# Patient Record
Sex: Female | Born: 1985 | Race: White | Hispanic: No | Marital: Single | Smoking: Current every day smoker
Health system: Southern US, Community
[De-identification: ages and names within clinical notes are randomized; demographics above are authoritative.]

## PROBLEM LIST (undated history)

## (undated) ENCOUNTER — Inpatient Hospital Stay (HOSPITAL_COMMUNITY): Payer: Self-pay

## (undated) DIAGNOSIS — F419 Anxiety disorder, unspecified: Secondary | ICD-10-CM

## (undated) HISTORY — PX: WISDOM TOOTH EXTRACTION: SHX21

---
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
## 2008-04-02 ENCOUNTER — Emergency Department (HOSPITAL_COMMUNITY): Admission: EM | Admit: 2008-04-02 | Discharge: 2008-04-02 | Payer: Self-pay | Admitting: Emergency Medicine

## 2008-06-16 ENCOUNTER — Emergency Department: Payer: Self-pay | Admitting: Emergency Medicine

## 2009-02-03 ENCOUNTER — Emergency Department (HOSPITAL_COMMUNITY): Admission: EM | Admit: 2009-02-03 | Discharge: 2009-02-03 | Payer: Self-pay | Admitting: Emergency Medicine

## 2009-11-01 ENCOUNTER — Emergency Department (HOSPITAL_COMMUNITY): Admission: EM | Admit: 2009-11-01 | Discharge: 2009-11-01 | Payer: Self-pay | Admitting: Emergency Medicine

## 2010-03-18 IMAGING — CR DG CHEST 2V
2 series · 2 of 2 positions shown · non-contrast
Comparison: . None

CLINICAL DATA: Cough.  Smoker.

CHEST - 2 VIEW

[w chest pa]
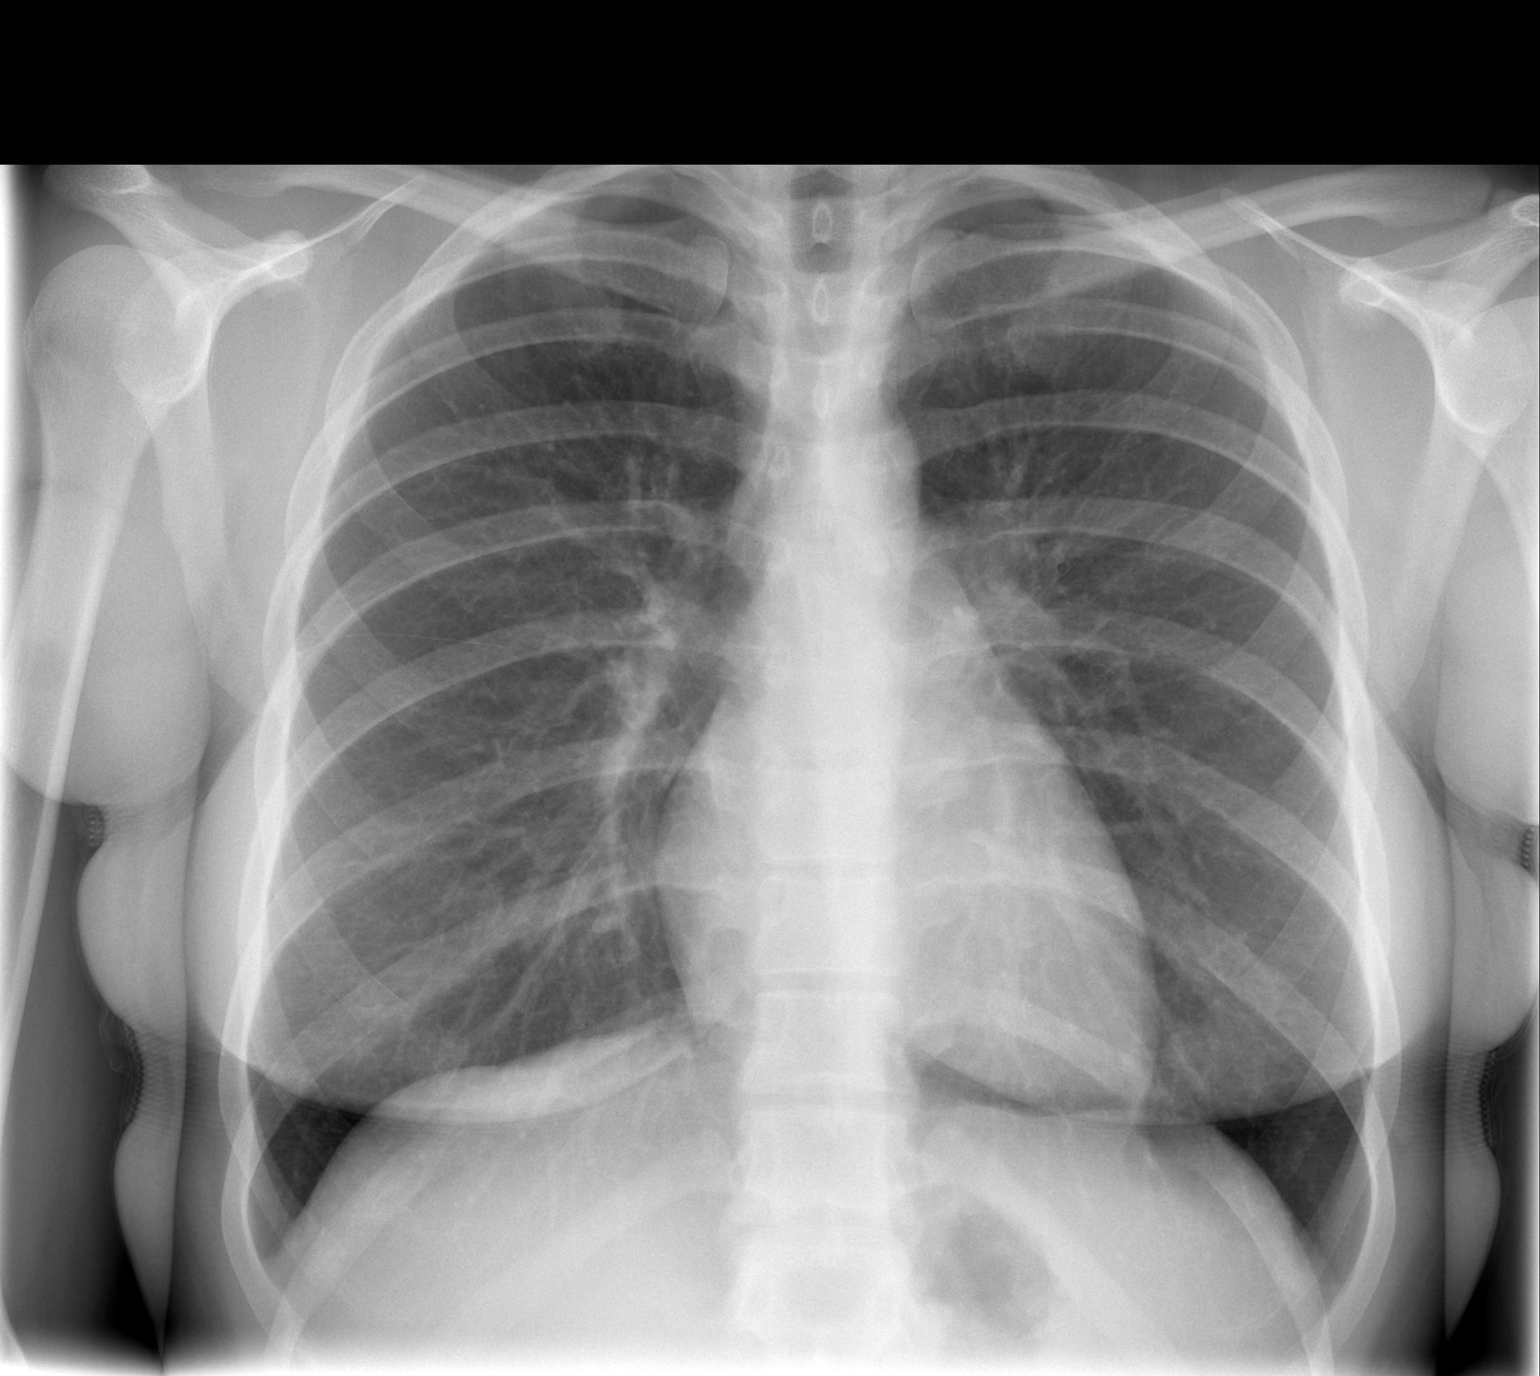

[w chest lat]
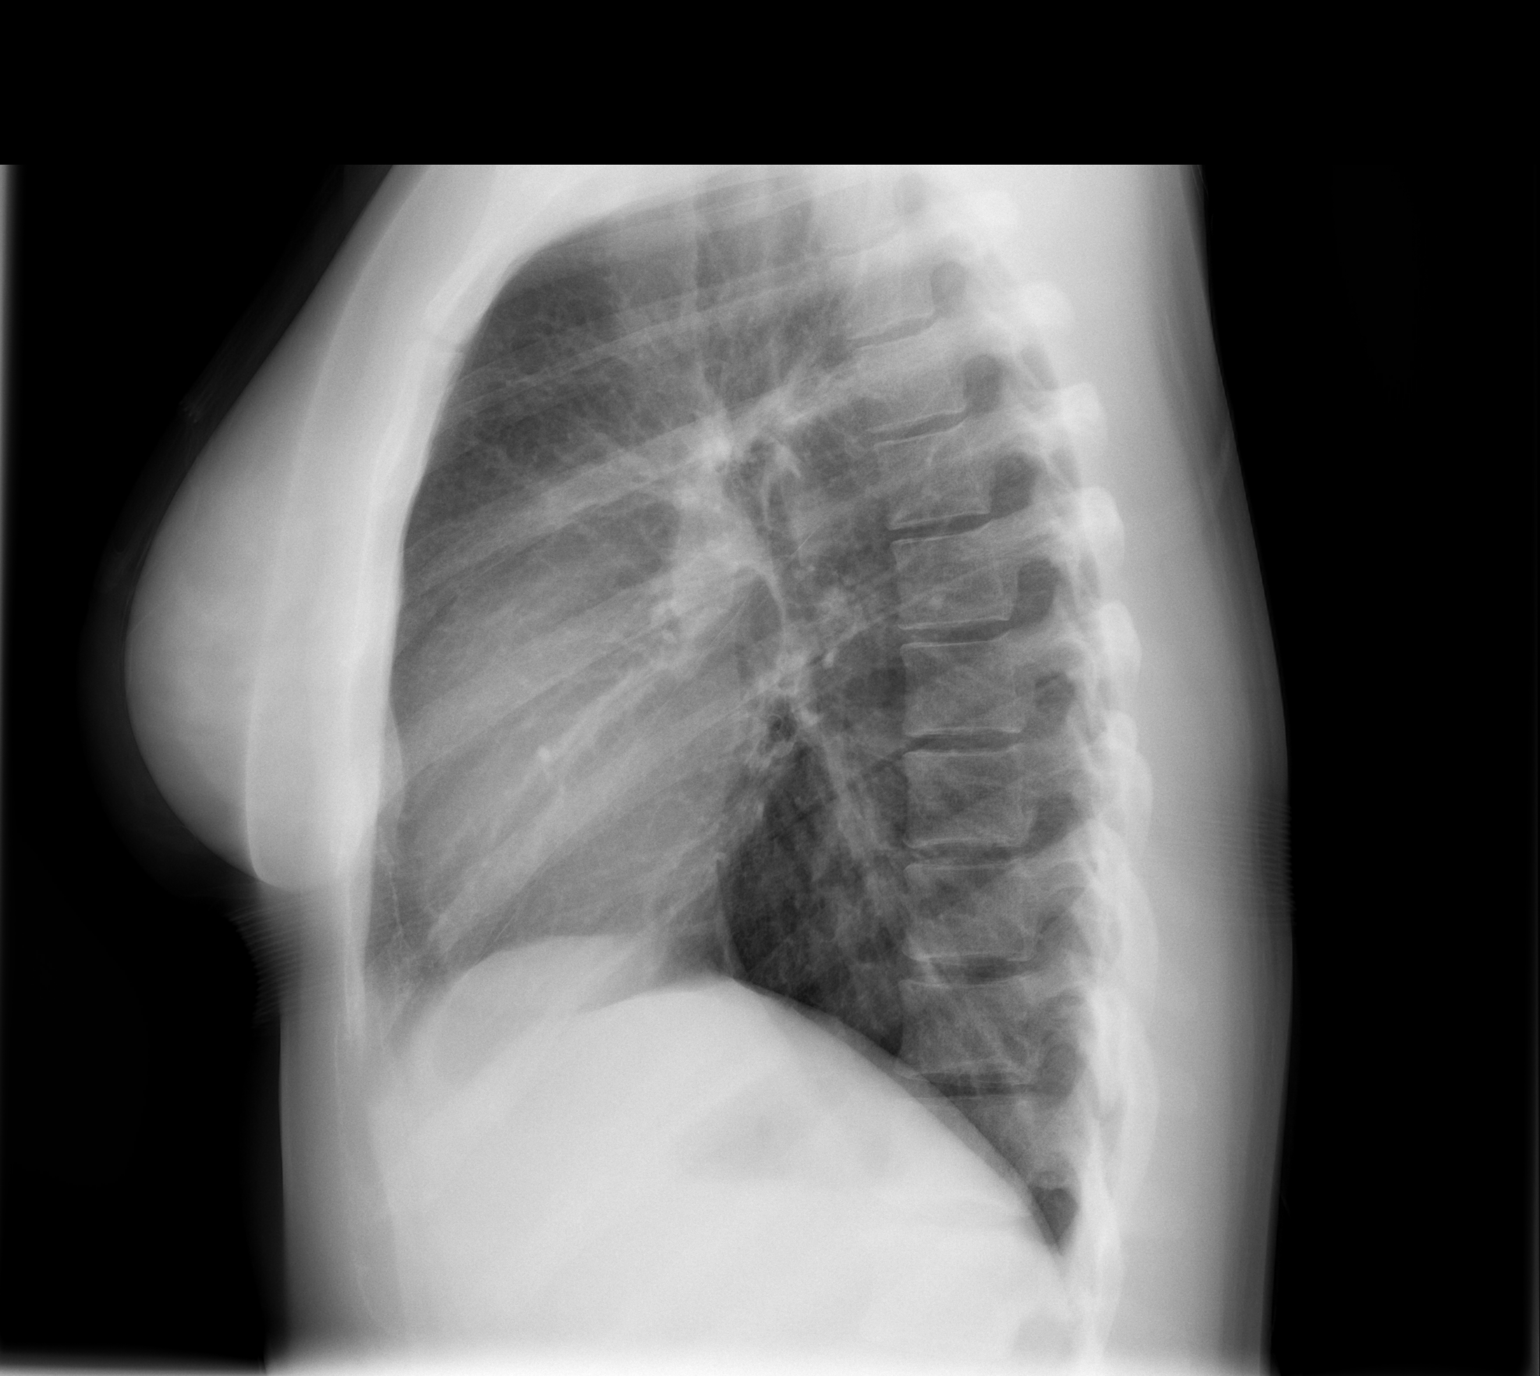

[2 of 2 positions shown; findings below may reference images not displayed]

FINDINGS: Mild to moderate hyperaeration of the lungs.  No acute
infiltrative process.  Normal cardiomediastinal silhouette.
Symmetrical hila.
IMPRESSION: Mild to moderate hyperaeration.

## 2010-12-23 ENCOUNTER — Encounter: Payer: Self-pay | Admitting: *Deleted

## 2011-03-05 LAB — POCT PREGNANCY, URINE: Preg Test, Ur: NEGATIVE

## 2011-03-14 LAB — URINALYSIS, ROUTINE W REFLEX MICROSCOPIC
Bilirubin Urine: NEGATIVE
Protein, ur: NEGATIVE mg/dL
Specific Gravity, Urine: 1.019 (ref 1.005–1.030)
Urobilinogen, UA: 0.2 mg/dL (ref 0.0–1.0)
pH: 8 (ref 5.0–8.0)

## 2011-03-14 LAB — URINE MICROSCOPIC-ADD ON

## 2011-05-01 ENCOUNTER — Other Ambulatory Visit: Payer: Self-pay | Admitting: Adult Health

## 2011-05-01 ENCOUNTER — Other Ambulatory Visit (HOSPITAL_COMMUNITY)
Admission: RE | Admit: 2011-05-01 | Discharge: 2011-05-01 | Disposition: A | Discharge status: Home / Self Care | Payer: Self-pay | Source: Ambulatory Visit | Attending: Obstetrics and Gynecology | Admitting: Obstetrics and Gynecology

## 2011-05-01 DIAGNOSIS — Z113 Encounter for screening for infections with a predominantly sexual mode of transmission: Secondary | ICD-10-CM | POA: Insufficient documentation

## 2011-05-01 DIAGNOSIS — Z01419 Encounter for gynecological examination (general) (routine) without abnormal findings: Secondary | ICD-10-CM | POA: Insufficient documentation

## 2013-09-10 ENCOUNTER — Encounter (HOSPITAL_COMMUNITY): Payer: Self-pay

## 2013-09-10 ENCOUNTER — Inpatient Hospital Stay (HOSPITAL_COMMUNITY): Payer: Self-pay

## 2013-09-10 ENCOUNTER — Inpatient Hospital Stay (HOSPITAL_COMMUNITY)
Admission: AD | Admit: 2013-09-10 | Discharge: 2013-09-10 | Disposition: A | Discharge status: Home / Self Care | Payer: Self-pay | Source: Ambulatory Visit | Attending: Obstetrics & Gynecology | Admitting: Obstetrics & Gynecology

## 2013-09-10 DIAGNOSIS — B373 Candidiasis of vulva and vagina: Secondary | ICD-10-CM

## 2013-09-10 DIAGNOSIS — O2390 Unspecified genitourinary tract infection in pregnancy, unspecified trimester: Secondary | ICD-10-CM | POA: Insufficient documentation

## 2013-09-10 DIAGNOSIS — O9989 Other specified diseases and conditions complicating pregnancy, childbirth and the puerperium: Secondary | ICD-10-CM

## 2013-09-10 DIAGNOSIS — R109 Unspecified abdominal pain: Secondary | ICD-10-CM | POA: Insufficient documentation

## 2013-09-10 DIAGNOSIS — B3731 Acute candidiasis of vulva and vagina: Secondary | ICD-10-CM | POA: Insufficient documentation

## 2013-09-10 DIAGNOSIS — O26899 Other specified pregnancy related conditions, unspecified trimester: Secondary | ICD-10-CM

## 2013-09-10 HISTORY — DX: Anxiety disorder, unspecified: F41.9

## 2013-09-10 LAB — URINALYSIS, ROUTINE W REFLEX MICROSCOPIC
Bilirubin Urine: NEGATIVE
Glucose, UA: NEGATIVE mg/dL
Ketones, ur: NEGATIVE mg/dL
Leukocytes, UA: NEGATIVE
Protein, ur: NEGATIVE mg/dL
pH: 5.5 (ref 5.0–8.0)

## 2013-09-10 LAB — URINE MICROSCOPIC-ADD ON

## 2013-09-10 LAB — WET PREP, GENITAL: Trich, Wet Prep: NONE SEEN

## 2013-09-10 LAB — CBC
HCT: 35.3 % — ABNORMAL LOW (ref 36.0–46.0)
Hemoglobin: 12.1 g/dL (ref 12.0–15.0)
MCH: 30 pg (ref 26.0–34.0)
MCHC: 34.3 g/dL (ref 30.0–36.0)
MCV: 87.4 fL (ref 78.0–100.0)

## 2013-09-10 MED ORDER — FLUCONAZOLE 150 MG PO TABS: ORAL_TABLET | ORAL | Status: AC

## 2013-09-10 MED ORDER — FLUCONAZOLE 150 MG PO TABS
150.0000 mg | ORAL_TABLET | Freq: Every day | ORAL | Status: DC
  Administered 2013-09-10: 150 mg via ORAL
  Filled 2013-09-10: qty 1

## 2013-09-10 NOTE — MAU Provider Note (Signed)
History     CSN: [id number]  Arrival date and time: 09/10/13 1256   First Provider Initiated Contact with Patient 09/10/13 1616      Chief Complaint  Patient presents with  . Possible Pregnancy  . Abdominal Pain   HPI Tina Ayala is a 27 y.o. G3P0020 at [redacted]w[redacted]d who presents to MAU today with complaint of lower abdominal pain since last night. The patient states that she had +HPT on Monday. Pain is rated at 4/10 now. She states that it comes and goes and is sharp in nature. She denies vaginal bleeding or abnormal discharge. She denies N/V/D, but does have occasional constipation. She states last BM was yesterday. She denies dizziness or fever.    OB History   Grav Para Term Preterm Abortions TAB SAB Ect Mult Living   3    2  2    0      Past Medical History  Diagnosis Date  . Anxiety     Past Surgical History  Procedure Laterality Date  . Wisdom tooth extraction      History reviewed. No pertinent family history.  History  Substance Use Topics  . Smoking status: Current Every Day Smoker -- 0.50 packs/day    Types: Cigarettes  . Smokeless tobacco: Not on file  . Alcohol Use: No    Allergies: No Known Allergies  Prescriptions prior to admission  Medication Sig Dispense Refill  . acetaminophen (TYLENOL) 325 MG tablet Take 650 mg by mouth every 6 (six) hours as needed for pain (For headache.).      Marland Kitchen b complex vitamins tablet Take 1 tablet by mouth daily.      . bisacodyl (DULCOLAX) 5 MG EC tablet Take 10 mg by mouth daily as needed for constipation.      . diphenhydrAMINE (BENADRYL) 25 MG tablet Take 50 mg by mouth 2 (two) times daily as needed for allergies.      . Multiple Vitamins-Minerals (HAIR/SKIN/NAILS PO) Take 2 capsules by mouth daily.        Review of Systems  Constitutional: Negative for fever and malaise/fatigue.  Gastrointestinal: Positive for abdominal pain and constipation. Negative for nausea, vomiting and diarrhea.  Genitourinary:  Negative for dysuria, urgency and frequency.       Neg - vaginal bleeding, discharge  Neurological: Negative for dizziness, loss of consciousness and weakness.   Physical Exam   Blood pressure 106/64, pulse 91, temperature 98.6 F (37 C), temperature source Oral, resp. rate 16, height 5' 1.5" (1.562 m), weight 141 lb 12.8 oz (64.32 kg), last menstrual period 07/06/2013, SpO2 100.00%.  Physical Exam  Constitutional: She is oriented to person, place, and time. She appears well-developed and well-nourished. No distress.  HENT:  Head: Normocephalic and atraumatic.  Cardiovascular: Normal rate, regular rhythm and normal heart sounds.   Respiratory: Effort normal and breath sounds normal. No respiratory distress.  GI: Soft. Bowel sounds are normal. She exhibits no distension and no mass. There is tenderness (mild tenderness to palpation of the lower abdomen at midline). There is no rebound and no guarding.  Genitourinary: Uterus is not enlarged and not tender. Cervix exhibits no motion tenderness, no discharge and no friability. Right adnexum displays no mass and no tenderness. Left adnexum displays no mass and no tenderness. No bleeding around the vagina. Vaginal discharge (moderate amount of thin, white discharge noted) found.  Neurological: She is alert and oriented to person, place, and time.  Skin: Skin is warm and dry. No erythema.  Psychiatric: She has a normal mood and affect.   Results for orders placed during the hospital encounter of 09/10/13 (from the past 24 hour(s))  URINALYSIS, ROUTINE W REFLEX MICROSCOPIC     Status: Abnormal   Collection Time    09/10/13  1:35 PM      Result Value Range   Color, Urine YELLOW  YELLOW   APPearance CLEAR  CLEAR   Specific Gravity, Urine >1.030 (*) 1.005 - 1.030   pH 5.5  5.0 - 8.0   Glucose, UA NEGATIVE  NEGATIVE mg/dL   Hgb urine dipstick MODERATE (*) NEGATIVE   Bilirubin Urine NEGATIVE  NEGATIVE   Ketones, ur NEGATIVE  NEGATIVE mg/dL    Protein, ur NEGATIVE  NEGATIVE mg/dL   Urobilinogen, UA 0.2  0.0 - 1.0 mg/dL   Nitrite NEGATIVE  NEGATIVE   Leukocytes, UA NEGATIVE  NEGATIVE  URINE MICROSCOPIC-ADD ON     Status: Abnormal   Collection Time    09/10/13  1:35 PM      Result Value Range   Squamous Epithelial / LPF MANY (*) RARE   WBC, UA 0-2  <3 WBC/hpf   RBC / HPF 0-2  <3 RBC/hpf   Bacteria, UA FEW (*) RARE   Urine-Other FEW YEAST    POCT PREGNANCY, URINE     Status: Abnormal   Collection Time    09/10/13  1:46 PM      Result Value Range   Preg Test, Ur POSITIVE (*) NEGATIVE  CBC     Status: Abnormal   Collection Time    09/10/13  1:48 PM      Result Value Range   WBC 8.4  4.0 - 10.5 K/uL   RBC 4.04  3.87 - 5.11 MIL/uL   Hemoglobin 12.1  12.0 - 15.0 g/dL   HCT 16.1 (*) 09.6 - 04.5 %   MCV 87.4  78.0 - 100.0 fL   MCH 30.0  26.0 - 34.0 pg   MCHC 34.3  30.0 - 36.0 g/dL   RDW 40.9  81.1 - 91.4 %   Platelets 310  150 - 400 K/uL  HCG, QUANTITATIVE, PREGNANCY     Status: Abnormal   Collection Time    09/10/13  1:48 PM      Result Value Range   hCG, Beta Chain, Quant, S 615 (*) <5 mIU/mL  WET PREP, GENITAL     Status: Abnormal   Collection Time    09/10/13  4:25 PM      Result Value Range   Yeast Wet Prep HPF POC MODERATE (*) NONE SEEN   Trich, Wet Prep NONE SEEN  NONE SEEN   Clue Cells Wet Prep HPF POC RARE (*) NONE SEEN   WBC, Wet Prep HPF POC FEW (*) NONE SEEN    US Ob Comp Less 14 Wks  09/10/2013   CLINICAL DATA:  Positive pregnancy test, pelvic pain  EXAM: OBSTETRIC <14 WK [place] AND TRANSVAGINAL OB US  TECHNIQUE: Both transabdominal and transvaginal ultrasound examinations were performed for complete evaluation of the gestation as well as the maternal uterus, adnexal regions, and pelvic cul-de-sac. Transvaginal technique was performed to assess early pregnancy.  COMPARISON:  None.  FINDINGS: Intrauterine gestational sac: Not visualized  Yolk sac:  Not visualized  Embryo:  Not visualized  Cardiac Activity: Not  visualized  Maternal uterus/adnexae: The endometrium is diffusely prominent measuring 1.3 cm. Ovaries are normal. Presumed corpus luteum right ovary. Trace left adnexal free fluid.  IMPRESSION: No intrauterine gestational  sac, yolk sac, or fetal pole identified. Differential considerations include intrauterine pregnancy too early to be sonographically visualized, missed abortion, or ectopic pregnancy. Followup ultrasound is recommended in 10-14 days for further evaluation.   Electronically Signed   By: Christiana Pellant M.D.   On: 09/10/2013 15:20   US Ob Transvaginal  09/10/2013   CLINICAL DATA:  Positive pregnancy test, pelvic pain  EXAM: OBSTETRIC <14 WK [place] AND TRANSVAGINAL OB US  TECHNIQUE: Both transabdominal and transvaginal ultrasound examinations were performed for complete evaluation of the gestation as well as the maternal uterus, adnexal regions, and pelvic cul-de-sac. Transvaginal technique was performed to assess early pregnancy.  COMPARISON:  None.  FINDINGS: Intrauterine gestational sac: Not visualized  Yolk sac:  Not visualized  Embryo:  Not visualized  Cardiac Activity: Not visualized  Maternal uterus/adnexae: The endometrium is diffusely prominent measuring 1.3 cm. Ovaries are normal. Presumed corpus luteum right ovary. Trace left adnexal free fluid.  IMPRESSION: No intrauterine gestational sac, yolk sac, or fetal pole identified. Differential considerations include intrauterine pregnancy too early to be sonographically visualized, missed abortion, or ectopic pregnancy. Followup ultrasound is recommended in 10-14 days for further evaluation.   Electronically Signed   By: Christiana Pellant M.D.   On: 09/10/2013 15:20    MAU Course  Procedures None  MDM + UPT UA, Wet prep, GC/Chlamydia, CBC, ABO/Rh, quant hCG and [place] today Patient given 150 mg Diflucan in MAU today Assessment and Plan  A: Abdominal pain in pregnancy, first trimester Yeast vulvovaginitis  P: Discharge home Rx for  Diflucan to be taken in 3 days given to patient Patient advised to take Tylenol PRN for pain Patient to return to MAU in 48 hours for follow-up quant hCG Bleeding and ectopic precautions discussed Patient may return to MAU as needed sooner  Freddi Starr, PA-C  09/10/2013, 5:01 PM

## 2013-09-10 NOTE — MAU Note (Signed)
Patient states she has had a positive home pregnancy test. States she started having lower abdominal pain last night. Denies bleeding or discharge. No nausea or vomiting.

## 2013-09-11 LAB — GC/CHLAMYDIA PROBE AMP: CT Probe RNA: NEGATIVE

## 2014-05-23 ENCOUNTER — Encounter (HOSPITAL_COMMUNITY): Payer: Self-pay

## 2014-07-16 ENCOUNTER — Encounter (HOSPITAL_COMMUNITY): Payer: Self-pay | Admitting: *Deleted

## 2014-10-03 ENCOUNTER — Encounter (HOSPITAL_COMMUNITY): Payer: Self-pay | Admitting: *Deleted

## 2022-12-27 ENCOUNTER — Encounter: Payer: Self-pay | Admitting: Neurology

## 2022-12-27 ENCOUNTER — Other Ambulatory Visit: Payer: Self-pay

## 2022-12-27 DIAGNOSIS — R202 Paresthesia of skin: Secondary | ICD-10-CM

## 2023-01-07 ENCOUNTER — Encounter: Payer: Self-pay | Admitting: Neurology

## 2023-01-07 ENCOUNTER — Ambulatory Visit: Payer: Medicaid Other | Admitting: Neurology

## 2023-01-07 DIAGNOSIS — G5603 Carpal tunnel syndrome, bilateral upper limbs: Secondary | ICD-10-CM

## 2023-01-07 DIAGNOSIS — R202 Paresthesia of skin: Secondary | ICD-10-CM | POA: Diagnosis not present

## 2023-01-07 NOTE — Procedures (Signed)
[facility]  [address], [address]  [place], [place] [address] Tel: [phone] Fax: [phone] Test Date:  01/07/2023  Patient: Tina Ayala DOB: 17-Jun-1986 Physician: Kai Levins, MD  Sex: Female Height: ' " Ref Phys: Judithann Sauger, MD  ID#: [id number]   Technician:    History: This is a 37 year old female with bilateral hand pain.  NCV & EMG Findings: Extensive electrodiagnostic evaluation of bilateral upper limbs with related paraspinal muscles (C7 level) shows: Right median sensory response shows prolonged distal peak latency (4.3 ms) and reduced amplitude (16 V). Left median sensory response shows prolonged distal peak latency (3.8 ms). Bilateral ulnar and radial sensory responses are within normal limits. Bilateral median (APB) and ulnar (ADM) motor responses are within normal limits. There is no evidence of active or chronic motor axon loss changes affecting any of the tested muscles on needle examination. Motor unit configuration and recruitment pattern is within normal limits.  Impression: This is an abnormal electrodiagnostic evaluation. 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 No electrodiagnostic evidence of a right or left cervical (C5-T1) motor radiculopathy. Screening studies for bilateral ulnar or radial mononeuropathies are normal.    ___________________________ Kai Levins, MD    Nerve Conduction Studies Motor Nerve Results    Latency Amplitude F-Lat Segment Distance CV Comment  Site (ms) Norm (mV) Norm (ms)  (cm) (m/s) Norm   Left Median (APB) Motor  Wrist 2.9  < 3.9 6.8  > 6.0        Elbow 6.9 - 6.3 -  Elbow-Wrist 23 58  > 50   Right Median (APB) Motor  Wrist 3.5  < 3.9 6.2  > 6.0        Elbow 7.7 - 6.1 -   Elbow-Wrist 24 57  > 50   Left Ulnar (ADM) Motor  Wrist 1.98  < 3.1 12.6  > 7.0        Bel elbow 4.9 - 12.0 -  Bel elbow-Wrist 18.5 64  > 50   Ab elbow 6.5 - 11.9 -  Ab elbow-Bel elbow 10 63 -   Right Ulnar (ADM) Motor  Wrist 2.1  < 3.1 12.5  > 7.0        Bel elbow 4.9 - 12.4 -  Bel elbow-Wrist 18 64  > 50   Ab elbow 6.4 - 12.0 -  Ab elbow-Bel elbow 10 67 -    Sensory Sites    Neg Peak Lat Amplitude (O-P) Segment Distance Velocity Comment  Site (ms) Norm (V) Norm  (cm) (ms)   Left Median Sensory  Wrist-Dig II *3.8  < 3.4 21  > 20 Wrist-Dig II 13    Right Median Sensory  Wrist-Dig II *4.3  < 3.4 *16  > 20 Wrist-Dig II 13    Left Radial Sensory  Forearm-Wrist 2.1  < 2.7 31  > 18 Forearm-Wrist 10    Right Radial Sensory  Forearm-Wrist 2.0  < 2.7 38  > 18 Forearm-Wrist 10    Left Ulnar Sensory  Wrist-Dig V 2.7  < 3.1 30  > 12 Wrist-Dig V 11    Right Ulnar Sensory  Wrist-Dig V 2.6  < 3.1 32  > 12 Wrist-Dig V 11     Electromyography   Side Muscle Ins.Act Fibs Fasc  Recrt Amp Dur Poly Activation Comment  Left FDI Nml Nml Nml Nml Nml Nml Nml Nml N/A  Left EIP Nml Nml Nml Nml Nml Nml Nml Nml N/A  Left APB Nml Nml Nml Nml Nml Nml Nml Nml N/A  Left Pronator teres Nml Nml Nml Nml Nml Nml Nml Nml N/A  Left Biceps Nml Nml Nml Nml Nml Nml Nml Nml N/A  Left Triceps Nml Nml Nml Nml Nml Nml Nml Nml N/A  Left Deltoid Nml Nml Nml Nml Nml Nml Nml Nml N/A  Right FDI Nml Nml Nml Nml Nml Nml Nml Nml N/A  Right EIP Nml Nml Nml Nml Nml Nml Nml Nml N/A  Right APB Nml Nml Nml Nml Nml Nml Nml Nml N/A  Right Pronator teres Nml Nml Nml Nml Nml Nml Nml Nml N/A  Right Biceps Nml Nml Nml Nml Nml Nml Nml Nml N/A  Right Triceps Nml Nml Nml Nml Nml Nml Nml Nml N/A  Right Deltoid Nml Nml Nml Nml Nml Nml Nml Nml N/A  Right C7 PSP Nml Nml Nml Nml Nml Nml Nml Nml N/A  Left C7 PSP Nml Nml Nml Nml Nml Nml Nml Nml N/A      Waveforms:  Motor           Sensory

## 2023-07-16 ENCOUNTER — Ambulatory Visit: Payer: MEDICAID | Admitting: Gastroenterology

## 2023-07-21 ENCOUNTER — Ambulatory Visit: Payer: MEDICAID | Admitting: Gastroenterology

## 2023-07-21 NOTE — Progress Notes (Deleted)
GI Office Note    Referring Provider: Orbie Hurst, NP Primary Care Physician:  Orbie Hurst, NP  Primary Gastroenterologist: ***  Chief Complaint   No chief complaint on file.  History of Present Illness   Tina Ayala is a 37 y.o. female presenting today at the request of Orbie Hurst, NP for ***constipation.   Per referral paperwork sent by OB/GYN, at her last visit 06/25/2023 she presented for follow-up after having her ultrasound performed.  Apparently per the ultrasonographer there was dilated bowels concerning for constipation and patient did admit to ultrasonographer that she was experiencing constipation.  She reported this is a chronic issue and she was taking laxatives every other day and she does not use a laxative it could take up to 1-1/2 weeks to have a bowel movement.  Constantly feels bloated.  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   Today:     Current Outpatient Medications  Medication Sig Dispense Refill   acetaminophen (TYLENOL) 325 MG tablet Take 650 mg by mouth every 6 (six) hours as needed for pain (For headache.).     b complex vitamins tablet Take 1 tablet by mouth daily.     bisacodyl (DULCOLAX) 5 MG EC tablet Take 10 mg by mouth daily as needed for constipation.     diphenhydrAMINE (BENADRYL) 25 MG tablet Take 50 mg by mouth 2 (two) times daily as needed for allergies.     fluconazole (DIFLUCAN) 150 MG tablet Take 1 tab (150 mg) in 3 days 1 tablet 0   Multiple Vitamins-Minerals (HAIR/SKIN/NAILS PO) Take 2 capsules by mouth daily.     No current facility-administered medications for this visit.    Past Medical History:  Diagnosis Date   Anxiety     Past Surgical History:  Procedure Laterality Date   WISDOM TOOTH EXTRACTION      No family history on file.  Allergies as of 07/21/2023   (No Known Allergies)    Social History   Socioeconomic  History   Marital status: Single    Spouse name: Not on file   Number of children: Not on file   Years of education: Not on file   Highest education level: Not on file  Occupational History   Not on file  Tobacco Use   Smoking status: Every Day    Current packs/day: 0.50    Types: Cigarettes   Smokeless tobacco: Not on file  Substance and Sexual Activity   Alcohol use: No   Drug use: No   Sexual activity: Yes  Other Topics Concern   Not on file  Social History Narrative   Not on file   Social Determinants of Health   Financial Resource Strain: Low Risk  (12/11/2022)   Received from [facility], [facility]   Overall Financial Resource Strain (CARDIA)    Difficulty of Paying Living Expenses: Not very hard  Food Insecurity: No Food Insecurity (12/11/2022)   Received from [facility], [facility]   Hunger Vital Sign    Worried About Running Out of Food in the Last Year: Never true    Ran Out of Food in the Last Year: Never true  Transportation Needs: No Transportation Needs (12/11/2022)   Received from [facility], [facility]   [facility] - Transportation    Lack of Transportation (Medical): No    Lack of Transportation (Non-Medical): No  Physical  Activity: Not on file  Stress: Not on file  Social Connections: Moderately Isolated (12/09/2022)   Received from [facility], [facility]   Social Connection and Isolation Panel [NHANES]    Frequency of Communication with Friends and Family: More than three times a week    Frequency of Social Gatherings with Friends and Family: Once a week    Attends Religious Services: Never    Database administrator or Organizations: No    Attends Banker Meetings: Never    Marital Status: Living with partner  Intimate Partner Violence: Not At Risk (05/01/2023)   Received from [facility]   Humiliation, Afraid, Rape, and Kick questionnaire    Fear of Current or Ex-Partner: No    Emotionally Abused:  No    Physically Abused: No    Sexually Abused: No     Review of Systems   Gen: Denies any fever, chills, fatigue, weight loss, lack of appetite.  CV: Denies chest pain, heart palpitations, peripheral edema, syncope.  Resp: Denies shortness of breath at rest or with exertion. Denies wheezing or cough.  GI: see HPI GU : Denies urinary burning, urinary frequency, urinary hesitancy MS: Denies joint pain, muscle weakness, cramps, or limitation of movement.  Derm: Denies rash, itching, dry skin Psych: Denies depression, anxiety, memory loss, and confusion Heme: Denies bruising, bleeding, and enlarged lymph nodes.   Physical Exam   There were no vitals taken for this visit.  General:   Alert and oriented. Pleasant and cooperative. Well-nourished and well-developed.  Head:  Normocephalic and atraumatic. Eyes:  Without icterus, sclera clear and conjunctiva pink.  Ears:  Normal auditory acuity. Mouth:  No deformity or lesions, oral mucosa pink.  Lungs:  Clear to auscultation bilaterally. No wheezes, rales, or rhonchi. No distress.  Heart:  S1, S2 present without murmurs appreciated.  Abdomen:  +BS, soft, non-tender and non-distended. No HSM noted. No guarding or rebound. No masses appreciated.  Rectal:  Deferred  Msk:  Symmetrical without gross deformities. Normal posture. Extremities:  Without edema. Neurologic:  Alert and  oriented x4;  grossly normal neurologically. Skin:  Intact without significant lesions or rashes. Psych:  Alert and cooperative. Normal mood and affect.   Assessment   Tina Ayala is a 37 y.o. female with a history of ADD, anxiety, depression, and tobacco abuse presenting today for evaluation of constipation.  Constipation with bloating:    PLAN   ***    Brooke Bonito, MSN, FNP-BC, AGACNP-BC [facility]

## 2024-10-06 ENCOUNTER — Ambulatory Visit: Payer: MEDICAID
